# Patient Record
Sex: Female | Born: 1990 | Race: White | Hispanic: No | Marital: Single | Smoking: Never smoker
Health system: Southern US, Community
[De-identification: ages and names within clinical notes are randomized; demographics above are authoritative.]

## PROBLEM LIST (undated history)

## (undated) HISTORY — PX: NO PAST SURGERIES: SHX2092

---

## 2017-11-21 ENCOUNTER — Ambulatory Visit
Admission: EM | Admit: 2017-11-21 | Discharge: 2017-11-21 | Disposition: A | Discharge status: Home / Self Care | Payer: 59 | Attending: Family Medicine | Admitting: Family Medicine

## 2017-11-21 ENCOUNTER — Other Ambulatory Visit: Payer: Self-pay

## 2017-11-21 DIAGNOSIS — R69 Illness, unspecified: Secondary | ICD-10-CM

## 2017-11-21 DIAGNOSIS — R6883 Chills (without fever): Secondary | ICD-10-CM

## 2017-11-21 DIAGNOSIS — R05 Cough: Secondary | ICD-10-CM

## 2017-11-21 DIAGNOSIS — M791 Myalgia, unspecified site: Secondary | ICD-10-CM | POA: Diagnosis not present

## 2017-11-21 DIAGNOSIS — J111 Influenza due to unidentified influenza virus with other respiratory manifestations: Secondary | ICD-10-CM

## 2017-11-21 DIAGNOSIS — J029 Acute pharyngitis, unspecified: Secondary | ICD-10-CM | POA: Diagnosis not present

## 2017-11-21 LAB — RAPID STREP SCREEN (MED CTR MEBANE ONLY): STREPTOCOCCUS, GROUP A SCREEN (DIRECT): NEGATIVE

## 2017-11-21 MED ORDER — BENZONATATE 100 MG PO CAPS: 100.0000 mg | ORAL_CAPSULE | Freq: Three times a day (TID) | ORAL | 0 refills | Status: AC | PRN

## 2017-11-21 MED ORDER — HYDROCOD POLST-CPM POLST ER 10-8 MG/5ML PO SUER: 5.0000 mL | Freq: Every evening | ORAL | 0 refills | Status: AC | PRN

## 2017-11-21 MED ORDER — OSELTAMIVIR PHOSPHATE 75 MG PO CAPS: 75.0000 mg | ORAL_CAPSULE | Freq: Two times a day (BID) | ORAL | 0 refills | Status: AC

## 2017-11-21 NOTE — Discharge Instructions (Signed)
Take medication as prescribed. Rest. Drink plenty of fluids.  ° °Follow up with your primary care physician this week as needed. Return to Urgent care for new or worsening concerns.  ° °

## 2017-11-21 NOTE — ED Provider Notes (Signed)
[facility] ____________________________________________  Time seen: Approximately 5:54 PM  I have reviewed the triage vital signs and the nursing notes.   HISTORY  Chief Complaint Cough   HPI Kelly Cuevas is a 27 y.o. female presenting with significant other bedside for evaluation of runny nose, nasal congestion, cough, sore throat, chills, body aches present since Saturday night.  States unsure of what temperature was yesterday but definitely felt more so febrile, did not have a thermometer at home.  Has taken some over-the-counter ibuprofen yesterday as well as today with last dose being early this morning.  States sore throat is mild to moderate, but overall continues to drink fluids well, slight decrease in appetite.  The cough is primarily nonproductive hacking cough.  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  No other over-the-counter medications taken today for the same complaints.  Denies other aggravating factors.  Denies recent sickness. Denies chest pain, shortness of breath, abdominal pain, dysuria, or rash. Denies recent antibiotic use.   Patient's last menstrual period was 10/24/2017.Denies pregnancy.  History reviewed. No pertinent past medical history.  There are no active problems to display for this patient.   Past Surgical History:  Procedure Laterality Date  . NO PAST SURGERIES       No current facility-administered medications for this encounter.   Current Outpatient Medications:  .  benzonatate (TESSALON PERLES) 100 MG capsule, Take 1 capsule (100 mg total) by mouth 3 (three) times daily as needed for cough., Disp: 15 capsule, Rfl: 0 .  chlorpheniramine-HYDROcodone (TUSSIONEX PENNKINETIC ER) 10-8 MG/5ML SUER, Take 5 mLs by mouth at bedtime as needed for cough. do not drive or operate machinery while taking as can cause drowsiness., Disp: 75 mL, Rfl: 0 .  oseltamivir (TAMIFLU) 75 MG capsule, Take 1 capsule (75 mg  total) by mouth every 12 (twelve) hours., Disp: 10 capsule, Rfl: 0  Allergies Patient has no known allergies.  Family History  Problem Relation Age of Onset  . Cancer Mother   . Diabetes Father     Social History Social History   Tobacco Use  . Smoking status: Never Smoker  . Smokeless tobacco: Never Used  Substance Use Topics  . Alcohol use: Yes    Comment: rarely  . Drug use: No    Review of Systems Constitutional: As above. ENT: Positive sore throat. Cardiovascular: Denies chest pain. Respiratory: Denies shortness of breath. Gastrointestinal: No abdominal pain.   Skin: Negative for rash.  ____________________________________________   PHYSICAL EXAM:  VITAL SIGNS: ED Triage Vitals  Enc Vitals Group     BP 11/21/17 1642 140/75     Pulse Rate 11/21/17 1642 100     Resp 11/21/17 1642 18     Temp 11/21/17 1642 99.8 F (37.7 C)     Temp Source 11/21/17 1642 Oral     SpO2 11/21/17 1642 100 %     Weight 11/21/17 1640 280 lb (127 kg)     Height 11/21/17 1640 5\' 6"  (1.676 m)     Head Circumference --      Peak Flow --      Pain Score 11/21/17 1640 4     Pain Loc --      Pain Edu? --      Excl. in [place]? --    Constitutional: Alert and oriented. Well appearing and in no acute distress. Eyes: Conjunctivae are normal.  Head: Atraumatic. No sinus tenderness to palpation. No swelling. No erythema.  Ears: no  erythema, normal TMs bilaterally.   Nose:Nasal congestion with clear rhinorrhea  Mouth/Throat: Mucous membranes are moist. Mild pharyngeal erythema. No tonsillar swelling or exudate.  Neck: No stridor.  No cervical spine tenderness to palpation. Hematological/Lymphatic/Immunilogical: No cervical lymphadenopathy. Cardiovascular: Normal rate, regular rhythm. Grossly normal heart sounds.  Good peripheral circulation. Respiratory: Normal respiratory effort.  No retractions. No wheezes, rales or rhonchi. Good air movement.  Musculoskeletal: Ambulatory with steady gait.   Neurologic:  Normal speech and language. No gait instability. Skin:  Skin appears warm, dry and intact. No rash noted. Psychiatric: Mood and affect are normal. Speech and behavior are normal.  ___________________________________________   LABS (all labs ordered are listed, but only abnormal results are displayed)  Labs Reviewed  RAPID STREP SCREEN (NOT AT [facility])  CULTURE, GROUP A STREP [facility])     PROCEDURES Procedures    INITIAL IMPRESSION / ASSESSMENT AND PLAN / ED COURSE  Pertinent labs & imaging results that were available during my care of the patient were reviewed by me and considered in my medical decision making (see chart for details).  Overall well-appearing patient.  No acute distress.  Suspect influenza-like illness.  Quick strep negative, will culture.  Discussed treatment options with patient.  Will treat patient with oral Tamiflu, PRN Tessalon Perles, as needed Tussionex and over-the-counter decongestants as needed and Tylenol ibuprofen.  Encourage rest, fluids, supportive care.  Discussed strict follow-up and return parameters.  Work note given for today and tomorrow.Discussed indication, risks and benefits of medications with patient.  Discussed follow up with Primary care physician this week. Discussed follow up and return parameters including no resolution or any worsening concerns. Patient verbalized understanding and agreed to plan.   ____________________________________________   FINAL CLINICAL IMPRESSION(S) / ED DIAGNOSES  Final diagnoses:  Influenza-like illness     ED Discharge Orders        Ordered    oseltamivir (TAMIFLU) 75 MG capsule  Every 12 hours     11/21/17 1840    chlorpheniramine-HYDROcodone (TUSSIONEX PENNKINETIC ER) 10-8 MG/5ML SUER  At bedtime PRN     11/21/17 1840    benzonatate (TESSALON PERLES) 100 MG capsule  3 times daily PRN     11/21/17 1840       Note: This dictation was prepared with Dragon dictation along with smaller  phrase technology. Any transcriptional errors that result from this process are unintentional.         Renford DillsMiller, Rielle Schlauch, NP 11/21/17 1900

## 2017-11-21 NOTE — ED Triage Notes (Signed)
Patient complains of cough and congestion. Patient states that she has been having symptoms for 2-3 days. Patient states that she has recently been helping a friend move from a house that had a great deal of mold. Patient is thinking her symptoms may have been brought on from that.

## 2017-11-24 ENCOUNTER — Telehealth: Payer: Self-pay | Admitting: Emergency Medicine

## 2017-11-24 LAB — CULTURE, GROUP A STREP (THRC)

## 2017-11-24 NOTE — Telephone Encounter (Signed)
Called to follow up after patient's recent visit. Patient states she is starting to feel better. 

## 2019-06-12 ENCOUNTER — Emergency Department
Admission: EM | Admit: 2019-06-12 | Discharge: 2019-06-12 | Disposition: A | Discharge status: Home / Self Care | Payer: 59 | Attending: Emergency Medicine | Admitting: Emergency Medicine

## 2019-06-12 ENCOUNTER — Encounter: Payer: Self-pay | Admitting: Emergency Medicine

## 2019-06-12 ENCOUNTER — Emergency Department: Payer: 59

## 2019-06-12 ENCOUNTER — Other Ambulatory Visit: Payer: Self-pay

## 2019-06-12 DIAGNOSIS — Z79899 Other long term (current) drug therapy: Secondary | ICD-10-CM | POA: Insufficient documentation

## 2019-06-12 DIAGNOSIS — R0602 Shortness of breath: Secondary | ICD-10-CM | POA: Diagnosis not present

## 2019-06-12 DIAGNOSIS — R0789 Other chest pain: Secondary | ICD-10-CM | POA: Diagnosis present

## 2019-06-12 DIAGNOSIS — K219 Gastro-esophageal reflux disease without esophagitis: Secondary | ICD-10-CM | POA: Insufficient documentation

## 2019-06-12 DIAGNOSIS — R079 Chest pain, unspecified: Secondary | ICD-10-CM

## 2019-06-12 LAB — CBC
HCT: 37.4 % (ref 36.0–46.0)
Hemoglobin: 12.4 g/dL (ref 12.0–15.0)
MCH: 30 pg (ref 26.0–34.0)
MCHC: 33.2 g/dL (ref 30.0–36.0)
MCV: 90.6 fL (ref 80.0–100.0)
Platelets: 253 10*3/uL (ref 150–400)
RBC: 4.13 MIL/uL (ref 3.87–5.11)
RDW: 11.9 % (ref 11.5–15.5)
WBC: 6.5 10*3/uL (ref 4.0–10.5)
nRBC: 0 % (ref 0.0–0.2)

## 2019-06-12 LAB — BASIC METABOLIC PANEL
Anion gap: 6 (ref 5–15)
BUN: 14 mg/dL (ref 6–20)
CO2: 25 mmol/L (ref 22–32)
Calcium: 9.1 mg/dL (ref 8.9–10.3)
Chloride: 106 mmol/L (ref 98–111)
Creatinine, Ser: 0.61 mg/dL (ref 0.44–1.00)
GFR calc Af Amer: >60 mL/min (ref >60)
GFR calc non Af Amer: >60 mL/min (ref >60)
Glucose, Bld: 99 mg/dL (ref 70–99)
Potassium: 3.7 mmol/L (ref 3.5–5.1)
Sodium: 137 mmol/L (ref 135–145)

## 2019-06-12 LAB — TROPONIN I (HIGH SENSITIVITY)
Troponin I (High Sensitivity): <2 ng/L (ref <18)
Troponin I (High Sensitivity): <2 ng/L (ref <18)

## 2019-06-12 MED ORDER — LIDOCAINE VISCOUS HCL 2 % MT SOLN
15.0000 mL | Freq: Once | OROMUCOSAL | Status: AC
  Administered 2019-06-12: 15 mL via ORAL
  Filled 2019-06-12: qty 15

## 2019-06-12 MED ORDER — ALUM & MAG HYDROXIDE-SIMETH 200-200-20 MG/5ML PO SUSP
30.0000 mL | Freq: Once | ORAL | Status: AC
  Administered 2019-06-12: 30 mL via ORAL
  Filled 2019-06-12: qty 30

## 2019-06-12 MED ORDER — FAMOTIDINE 40 MG PO TABS: 40.0000 mg | ORAL_TABLET | Freq: Every evening | ORAL | 1 refills | Status: AC

## 2019-06-12 NOTE — ED Notes (Signed)
Patient states she was sleeping and was awaken by the feeling of not being able to breath and was having some chest pain in middle of chest. Earlier in week was having back pain and pain shooting into left shoulder.

## 2019-06-12 NOTE — ED Triage Notes (Signed)
Pt reports she woke this morning with sudden onset of SOB and central chest pain. Pt has hx/o anxiety. Pt sts, "I don't know if its just anxiety because im kinda going through a lot. I am going through a separation and just came out to my family." Pt is tearful in triage.

## 2019-06-12 NOTE — Discharge Instructions (Addendum)

## 2019-06-12 NOTE — ED Provider Notes (Signed)
7:00 AM Assumed care for off going team.   Blood pressure (!) 147/128, pulse 85, temperature 98.5 F (36.9 C), temperature source Oral, resp. rate 20, height 5\' 6"  (1.676 m), weight 128.4 kg, SpO2 99 %.  See their HPI for full report but in brief   Repeat trop-woke up with SOB, chest pain. Does have some anxiety as well.  GI cocktail. Pain resolved. If second trop negative can d/c.   7:45 AM reevaluated patient.  Second cardiac marker was negative.  Reevaluated patient and updated on results.  Patient feels comfortable with discharge home.  Patient will monitor symptoms and follow-up with her primary care doctor or return to the ER if her symptoms are worsening.  I discussed the provisional nature of ED diagnosis, the treatment so far, the ongoing plan of care, follow up appointments and return precautions with the patient and any family or support people present. They expressed understanding and agreed with the plan, discharged home.         Vanessa Griggs, MD 06/12/19 [phone]

## 2019-06-12 NOTE — ED Provider Notes (Signed)
[facility] Emergency Department Provider Note  ____________________________________________  Time seen: Approximately 4:36 AM  I have reviewed the triage vital signs and the nursing notes.   HISTORY  Chief Complaint Chest Pain and Shortness of Breath   HPI Kelly Cuevas is a 28 y.o. female with a history of anxiety who presents for evaluation of chest pain and shortness of breath.  Patient reports that over the last several months she has been suffering from a lot of anxiety and stress.  She has been having anxiety attacks.  She went to bed last night and she was in her usual state of health.  She reports waking up briefly in the middle of the night to notice that she was having trouble breathing.  She reports that she can sometimes wake up like that when she has a bad dream but her symptoms usually resolve within a few minutes.  When the symptoms did not get better patient started to get very anxious and started having chest pain that she describes as pressure in the center of her chest.  She sat up next to the bed, waiting for a friend to pick her up to bring her to the emergency room, she started feeling lightheaded.  At this time she feels improved but is still complaining of mild central chest pressure.  Shortness of breath has resolved.  No SI or HI.  She is not a smoker.  She smokes marijuana.  Denies any other drug use.  No personal or family history of heart attacks, PE or DVT, recent travel immobilization, leg pain or swelling, hemoptysis, or exogenous hormones.   PMH Anxiety Obesity  Past Surgical History:  Procedure Laterality Date  . NO PAST SURGERIES      Prior to Admission medications   Medication Sig Start Date End Date Taking? Authorizing Provider  benzonatate (TESSALON PERLES) 100 MG capsule Take 1 capsule (100 mg total) by mouth 3 (three) times daily as needed for cough. 11/21/17   Renford DillsMiller, [place], NP  chlorpheniramine-HYDROcodone  [facility](TUSSIONEX PENNKINETIC ER) 10-8 MG/5ML SUER Take 5 mLs by mouth at bedtime as needed for cough. do not drive or operate machinery while taking as can cause drowsiness. 11/21/17   Renford DillsMiller, [place], NP  famotidine (PEPCID) 40 MG tablet Take 1 tablet (40 mg total) by mouth every evening. 06/12/19 06/11/20  Nita SickleVeronese, [place], MD  oseltamivir (TAMIFLU) 75 MG capsule Take 1 capsule (75 mg total) by mouth every 12 (twelve) hours. 11/21/17   Renford DillsMiller, [place], NP    Allergies Patient has no known allergies.  Family History  Problem Relation Age of Onset  . Cancer Mother   . Diabetes Father     Social History Social History   Tobacco Use  . Smoking status: Never Smoker  . Smokeless tobacco: Never Used  Substance Use Topics  . Alcohol use: Yes    Comment: rarely  . Drug use: No    Review of Systems  Constitutional: Negative for fever. Eyes: Negative for visual changes. ENT: Negative for sore throat. Neck: No neck pain  Cardiovascular: + chest pain. Respiratory: + shortness of breath. Gastrointestinal: Negative for abdominal pain, vomiting or diarrhea. Genitourinary: Negative for dysuria. Musculoskeletal: Negative for back pain. Skin: Negative for rash. Neurological: Negative for headaches, weakness or numbness. Psych: No SI or HI. + anxiety  ____________________________________________   PHYSICAL EXAM:  VITAL SIGNS: ED Triage Vitals  Enc Vitals Group     BP 06/12/19 0415 (!) 147/128     Pulse  Rate 06/12/19 0415 85     Resp 06/12/19 0415 20     Temp 06/12/19 0415 98.5 F (36.9 C)     Temp Source 06/12/19 0415 Oral     SpO2 06/12/19 0415 99 %     Weight 06/12/19 0412 283 lb (128.4 kg)     Height 06/12/19 0412 5\' 6"  (1.676 m)     Head Circumference --      Peak Flow --      Pain Score 06/12/19 0411 4     Pain Loc --      Pain Edu? --      Excl. in [place]? --     Constitutional: Alert and oriented, anxious but in no distress. HEENT:      Head: Normocephalic and atraumatic.          Eyes: Conjunctivae are normal. Sclera is non-icteric.       Mouth/Throat: Mucous membranes are moist.       Neck: Supple with no signs of meningismus. Cardiovascular: Regular rate and rhythm. No murmurs, gallops, or rubs. 2+ symmetrical distal pulses are present in all extremities. No JVD. Respiratory: Normal respiratory effort. Lungs are clear to auscultation bilaterally. No wheezes, crackles, or rhonchi.  Gastrointestinal: Soft, non tender, and non distended with positive bowel sounds. No rebound or guarding. Musculoskeletal: Nontender with normal range of motion in all extremities. No edema, cyanosis, or erythema of extremities. Neurologic: Normal speech and language. Face is symmetric. Moving all extremities. No gross focal neurologic deficits are appreciated. Skin: Skin is warm, dry and intact. No rash noted. Psychiatric: Mood and affect are normal. Speech and behavior are normal.  ____________________________________________   LABS (all labs ordered are listed, but only abnormal results are displayed)  Labs Reviewed  BASIC METABOLIC PANEL  CBC  POC URINE PREG, ED  TROPONIN I (HIGH SENSITIVITY)  TROPONIN I (HIGH SENSITIVITY)   ____________________________________________  EKG  ED ECG REPORT I, Rudene Re, the attending physician, personally viewed and interpreted this ECG.  Normal sinus rhythm, rate of 81, normal intervals, normal axis, no ST elevations or depressions. ____________________________________________  RADIOLOGY  I have personally reviewed the images performed during this visit and I agree with the Radiologist's read.   Interpretation by Radiologist:  Dg Chest Portable 1 View  Result Date: 06/12/2019 CLINICAL DATA:  Shortness of breath EXAM: PORTABLE CHEST 1 VIEW COMPARISON:  None. FINDINGS: Normal heart size and mediastinal contours. No acute infiltrate or edema. No effusion or pneumothorax. No acute osseous findings. IMPRESSION: Negative  chest. Electronically Signed   By: Monte Fantasia M.D.   On: 06/12/2019 05:01      ____________________________________________   PROCEDURES  Procedure(s) performed: None Procedures Critical Care performed:  None ____________________________________________   INITIAL IMPRESSION / ASSESSMENT AND PLAN / ED COURSE   28 y.o. female with a history of anxiety who presents for evaluation of chest pain and shortness of breath.  Patient is extremely anxious but in no other obvious distress.  EKG is nonischemic.  Will further stratified with troponin x2.  Low suspicion for ACS.  Will get a chest x-ray to rule out pneumothorax, pulmonary edema, pneumonia.  No signs or symptoms of COVID.  Will check labs to rule out anemia, dehydration, electrolyte abnormalities, AKI.  Will monitor on telemetry for any signs of dysrhythmias.  Will give a GI cocktail for possible reflux.  Patient denies SI or HI, does not meet IVC criteria.  PERC negative.  Clinical Course as of Jun 11 637  Tue Jun 12, 2019  [id number] Pain resolved with a GI cocktail.  Possibly reflux as patient seems to be having these episodes while laying flat. First troponin is negative.  Second troponin is pending.  Care transferred to incoming physician at 7 AM with plan to follow-up results of second troponin and reassess patient for disposition.   [CV]    Clinical Course User Index [CV] Don PerkingVeronese, [place], MD      As part of my medical decision making, I reviewed the following data within the electronic MEDICAL RECORD NUMBER Nursing notes reviewed and incorporated, Labs reviewed , EKG interpreted , Old EKG reviewed, Old chart reviewed, Radiograph reviewed , Notes from prior ED visits and [place] Controlled Substance Database   Patient was evaluated in Emergency Department today for the symptoms described in the history of present illness. Patient was evaluated in the context of the global COVID-19 pandemic, which necessitated consideration that the  patient might be at risk for infection with the SARS-CoV-2 virus that causes COVID-19. Institutional protocols and algorithms that pertain to the evaluation of patients at risk for COVID-19 are in a state of rapid change based on information released by regulatory bodies including the CDC and federal and state organizations. These policies and algorithms were followed during the patient's care in the ED.   ____________________________________________   FINAL CLINICAL IMPRESSION(S) / ED DIAGNOSES   Final diagnoses:  Chest pain, unspecified type  Shortness of breath  Gastroesophageal reflux disease, esophagitis presence not specified      NEW MEDICATIONS STARTED DURING THIS VISIT:  ED Discharge Orders         Ordered    famotidine (PEPCID) 40 MG tablet  Every evening     06/12/19 [id number]           Note:  This document was prepared using Dragon voice recognition software and may include unintentional dictation errors.    Don PerkingVeronese, [place], MD 06/12/19 [phone]

## 2019-06-12 NOTE — ED Notes (Signed)
Pt discharge instructions reviewed. Unable to electronically sign. Paper copy was printed, signed and placed in medical records pick-up bin.

## 2020-12-17 ENCOUNTER — Other Ambulatory Visit: Payer: Self-pay

## 2020-12-17 ENCOUNTER — Emergency Department
Admission: EM | Admit: 2020-12-17 | Discharge: 2020-12-17 | Disposition: A | Discharge status: Home / Self Care | Payer: Self-pay | Attending: Emergency Medicine | Admitting: Emergency Medicine

## 2020-12-17 ENCOUNTER — Encounter: Payer: Self-pay | Admitting: Emergency Medicine

## 2020-12-17 ENCOUNTER — Emergency Department: Payer: Self-pay

## 2020-12-17 DIAGNOSIS — R0602 Shortness of breath: Secondary | ICD-10-CM | POA: Insufficient documentation

## 2020-12-17 DIAGNOSIS — R072 Precordial pain: Secondary | ICD-10-CM | POA: Insufficient documentation

## 2020-12-17 DIAGNOSIS — R002 Palpitations: Secondary | ICD-10-CM | POA: Insufficient documentation

## 2020-12-17 DIAGNOSIS — Z8616 Personal history of COVID-19: Secondary | ICD-10-CM | POA: Insufficient documentation

## 2020-12-17 LAB — CBC
HCT: 37.7 % (ref 36.0–46.0)
Hemoglobin: 12.9 g/dL (ref 12.0–15.0)
MCH: 30.5 pg (ref 26.0–34.0)
MCHC: 34.2 g/dL (ref 30.0–36.0)
MCV: 89.1 fL (ref 80.0–100.0)
Platelets: 261 10*3/uL (ref 150–400)
RBC: 4.23 MIL/uL (ref 3.87–5.11)
RDW: 11.9 % (ref 11.5–15.5)
WBC: 7.9 10*3/uL (ref 4.0–10.5)
nRBC: 0 % (ref 0.0–0.2)

## 2020-12-17 LAB — TROPONIN I (HIGH SENSITIVITY)
Troponin I (High Sensitivity): <2 ng/L (ref <18)
Troponin I (High Sensitivity): <2 ng/L (ref <18)

## 2020-12-17 LAB — BASIC METABOLIC PANEL
Anion gap: 7 (ref 5–15)
BUN: 11 mg/dL (ref 6–20)
CO2: 26 mmol/L (ref 22–32)
Calcium: 9.6 mg/dL (ref 8.9–10.3)
Chloride: 103 mmol/L (ref 98–111)
Creatinine, Ser: 0.67 mg/dL (ref 0.44–1.00)
GFR, Estimated: >60 mL/min (ref >60)
Glucose, Bld: 93 mg/dL (ref 70–99)
Potassium: 4 mmol/L (ref 3.5–5.1)
Sodium: 136 mmol/L (ref 135–145)

## 2020-12-17 LAB — POC URINE PREG, ED: Preg Test, Ur: NEGATIVE

## 2020-12-17 NOTE — ED Provider Notes (Signed)
[facility] Emergency Department Provider Note ____________________________________________   Event Date/Time   First MD Initiated Contact with Patient 12/17/20 2258     (approximate)  I have reviewed the triage vital signs and the nursing notes.  HISTORY  Chief Complaint Chest Pain and Shortness of Breath   HPI Kelly Cuevas is a 30 y.o. femalewho presents to the ED for evaluation of chest pain and SOB.   Chart review indicates hx obesity.  Patient self-reports a diagnosis of COVID-19 about 6 weeks ago and she was managed as an outpatient at home. Takes no prescription medications and not on OCPs.  Patient presents with her fianc, who provides additional history, for evaluation of palpitations and shortness of breath for the past "few weeks."  She reports that since being diagnosed with COVID-19 she has had dyspnea on exertion, more short of breath and with intermittent pain/palpitations to her substernal chest.  She intermittently describes palpitations while otherwise describing pain to her chest and is inconsistent.  She reports pain lasting a matter of seconds-minutes before self resolving, 2-5 times per day the past few weeks.  She reports telling her fianc about these symptoms earlier today, so he urged her to come to the ED for evaluation.  She reports no chest pain here.   History reviewed. No pertinent past medical history.  There are no problems to display for this patient.   Past Surgical History:  Procedure Laterality Date  . NO PAST SURGERIES      Prior to Admission medications   Medication Sig Start Date End Date Taking? Authorizing Provider  benzonatate (TESSALON PERLES) 100 MG capsule Take 1 capsule (100 mg total) by mouth 3 (three) times daily as needed for cough. 11/21/17   Renford Dills, NP  chlorpheniramine-HYDROcodone [facility] PENNKINETIC ER) 10-8 MG/5ML SUER Take 5 mLs by mouth at bedtime as needed for cough. do not  drive or operate machinery while taking as can cause drowsiness. 11/21/17   Renford Dills, NP  famotidine (PEPCID) 40 MG tablet Take 1 tablet (40 mg total) by mouth every evening. 06/12/19 06/11/20  Nita Sickle, MD  oseltamivir (TAMIFLU) 75 MG capsule Take 1 capsule (75 mg total) by mouth every 12 (twelve) hours. 11/21/17   Renford Dills, NP    Allergies Patient has no known allergies.  Family History  Problem Relation Age of Onset  . Cancer Mother   . Diabetes Father     Social History Social History   Tobacco Use  . Smoking status: Never Smoker  . Smokeless tobacco: Never Used  Vaping Use  . Vaping Use: Every day  Substance Use Topics  . Alcohol use: Yes    Comment: rarely  . Drug use: No    Review of Systems  Constitutional: No fever/chills Eyes: No visual changes. ENT: No sore throat. Cardiovascular: Positive for chest pain or palpitations. Respiratory: Positive for shortness of breath. Gastrointestinal: No abdominal pain.  No nausea, no vomiting.  No diarrhea.  No constipation. Genitourinary: Negative for dysuria. Musculoskeletal: Negative for back pain. Skin: Negative for rash. Neurological: Negative for headaches, focal weakness or numbness.  ____________________________________________   PHYSICAL EXAM:  VITAL SIGNS: Vitals:   12/17/20 2234 12/17/20 2330  BP:  104/73  Pulse:  (!) 59  Resp:  16  Temp:    SpO2: 100% 100%     Constitutional: Alert and oriented. Well appearing and in no acute distress.  Obese.  Pleasant and conversational in full sentences. Eyes: Conjunctivae are normal. PERRL.  EOMI. Head: Atraumatic. Nose: No congestion/rhinnorhea. Mouth/Throat: Mucous membranes are moist.  Oropharynx non-erythematous. Neck: No stridor. No cervical spine tenderness to palpation. Cardiovascular: Normal rate, regular rhythm. Grossly normal heart sounds.  Good peripheral circulation. Respiratory: Normal respiratory effort.  No retractions. Lungs  CTAB. Gastrointestinal: Soft , nondistended, nontender to palpation. No CVA tenderness. Musculoskeletal: No lower extremity tenderness nor edema.  No joint effusions. No signs of acute trauma. Neurologic:  Normal speech and language. No gross focal neurologic deficits are appreciated. No gait instability noted. Skin:  Skin is warm, dry and intact. No rash noted. Psychiatric: Mood and affect are normal. Speech and behavior are normal.  ____________________________________________   LABS (all labs ordered are listed, but only abnormal results are displayed)  Labs Reviewed  BASIC METABOLIC PANEL  CBC  POC URINE PREG, ED  TROPONIN I (HIGH SENSITIVITY)  TROPONIN I (HIGH SENSITIVITY)   ____________________________________________  12 Lead EKG  Rhythm, rate of 61 bpm.  Normal axis and intervals.  No evidence of acute ischemia. ____________________________________________  RADIOLOGY  ED MD interpretation: 2 view CXR reviewed by me without evidence of acute cardiopulmonary pathology.  Official radiology report(s): DG Chest 2 View  Result Date: 12/17/2020 CLINICAL DATA:  Chest pain, shortness of breath EXAM: CHEST - 2 VIEW COMPARISON:  06/12/2019 FINDINGS: The heart size and mediastinal contours are within normal limits. Both lungs are clear. The visualized skeletal structures are unremarkable. IMPRESSION: No active cardiopulmonary disease. Electronically Signed   By: Sharlet Salina M.D.   On: 12/17/2020 18:03    ____________________________________________   PROCEDURES and INTERVENTIONS  Procedure(s) performed (including Critical Care):  .1-3 Lead EKG Interpretation Performed by: Delton Prairie, MD Authorized by: Delton Prairie, MD     Interpretation: normal     ECG rate:  62   ECG rate assessment: normal     Rhythm: sinus rhythm     Ectopy: none     Conduction: normal      Medications - No data to display  ____________________________________________   MDM / ED  COURSE   Obese 30 year old woman presents to the ED with subacute palpitations, dyspnea on exertion and chest discomfort, without evidence of acute pathology, and amenable to outpatient management.  Normal vitals on room air.  Exam is generally reassuring, demonstrating an obese patient who has no evidence of acute derangements.  No tachypnea, dyspnea, neurovascular deficits, distress or signs of trauma.  Blood work is benign without acute pathology.  EKG is nonischemic and high-sensitivity troponin is negative.  Patient is PERC negative.  I discussed with her the possibility of acute PE despite being PERC negative in the setting of recent COVID-19, but she reports reassurance with her work-up so far and refuses D-dimer or CTA chest.  We discussed outpatient management of her symptoms and return precautions for the ED.  Patient medically stable for outpatient management.  Clinical Course as of 12/17/20 2347  Wed Dec 17, 2020  2333 Extensive discussion at bedside with patient and her fianc regarding the possibility of acute PE.  We discussed that she is PERC negative, but this was performed prior to COVID-19.  We discussed overall reassuring work-up without signs of PE.  She reports that she is reassured and has no desire to stay for D-dimer or CTA chest.  She is requesting discharge.  We discussed outpatient management of her likely Covid-related symptoms.  We discussed return precautions for the ED.  Answered questions. [DS]    Clinical Course User Index [DS] Delton Prairie, MD  ____________________________________________   FINAL CLINICAL IMPRESSION(S) / ED DIAGNOSES  Final diagnoses:  Shortness of breath     ED Discharge Orders    None       Crystal Scarberry Katrinka Blazing   Note:  This document was prepared using Dragon voice recognition software and may include unintentional dictation errors.   Delton Prairie, MD 12/17/20 [phone]

## 2020-12-17 NOTE — ED Notes (Signed)
Pt given urine cup.

## 2020-12-17 NOTE — ED Triage Notes (Signed)
Pt comes into the ED via POV c/o chest pain and SHOB.  Pt states she has also had a "jabbing pain" in the shoulder.  Pt was dx with COVID last month.  Pt states her heart is palpating and the discomfort is centrally located.  Pt states the [facility] is increased significantly with exertion. Pt denies any sharp discomfort when deep breath is taken.  PT in NAD at this time and is ambulatory to triage.

## 2020-12-17 NOTE — Discharge Instructions (Signed)
Please take Tylenol and ibuprofen/Advil for your pain.  It is safe to take them together, or to alternate them every few hours.  Take up to 1000mg  of Tylenol at a time, up to 4 times per day.  Do not take more than 4000 mg of Tylenol in 24 hours.  For ibuprofen, take 400-600 mg, 4-5 times per day.  Return to the ED with any worsening chest pain with shortness of breath, passing out or severely worsening symptoms.

## 2020-12-17 NOTE — ED Notes (Signed)
This RN walked pt. O2 sat was 100% at the beginning, during, and at the end of the walk. Pt also states that chest has more pressure than pain.

## 2021-05-04 IMAGING — DX PORTABLE CHEST - 1 VIEW
1 series · 1 of 1 positions shown · non-contrast
Comparison: None.

CLINICAL DATA: Shortness of breath

EXAM:
PORTABLE CHEST 1 VIEW

[chest ap]
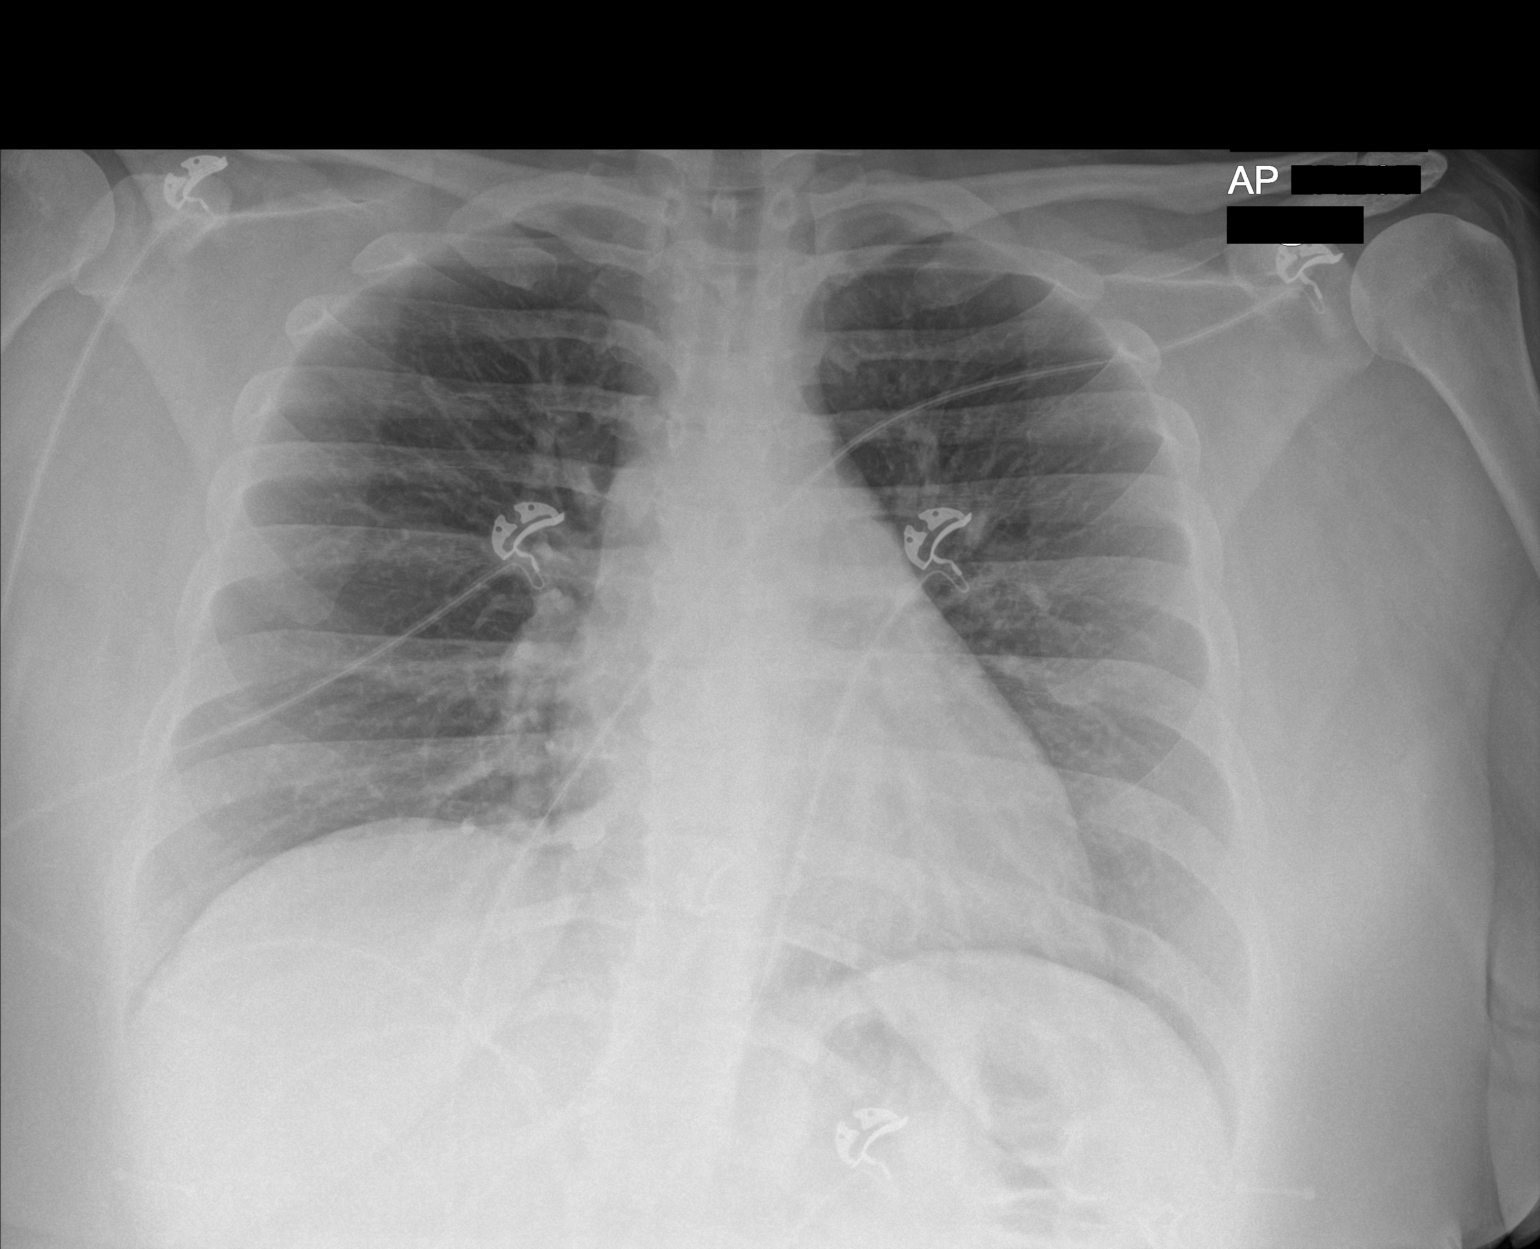

[1 of 1 positions shown; findings below may reference images not displayed]

FINDINGS: Normal heart size and mediastinal contours. No acute infiltrate or
edema. No effusion or pneumothorax. No acute osseous findings.
IMPRESSION: Negative chest.

## 2022-05-20 ENCOUNTER — Other Ambulatory Visit: Payer: Self-pay | Admitting: Family Medicine

## 2022-05-20 DIAGNOSIS — N926 Irregular menstruation, unspecified: Secondary | ICD-10-CM

## 2022-06-02 ENCOUNTER — Ambulatory Visit: Payer: Self-pay

## 2022-11-09 IMAGING — CR DG CHEST 2V
1 series · 2 of 2 positions shown · non-contrast
Comparison: 06/12/2019

CLINICAL DATA: Chest pain, shortness of breath

EXAM:
CHEST - 2 VIEW

[Series 1: dg chest 2 view · 0.14mm/px · 2 of 2 slices shown]
[im 1/2]
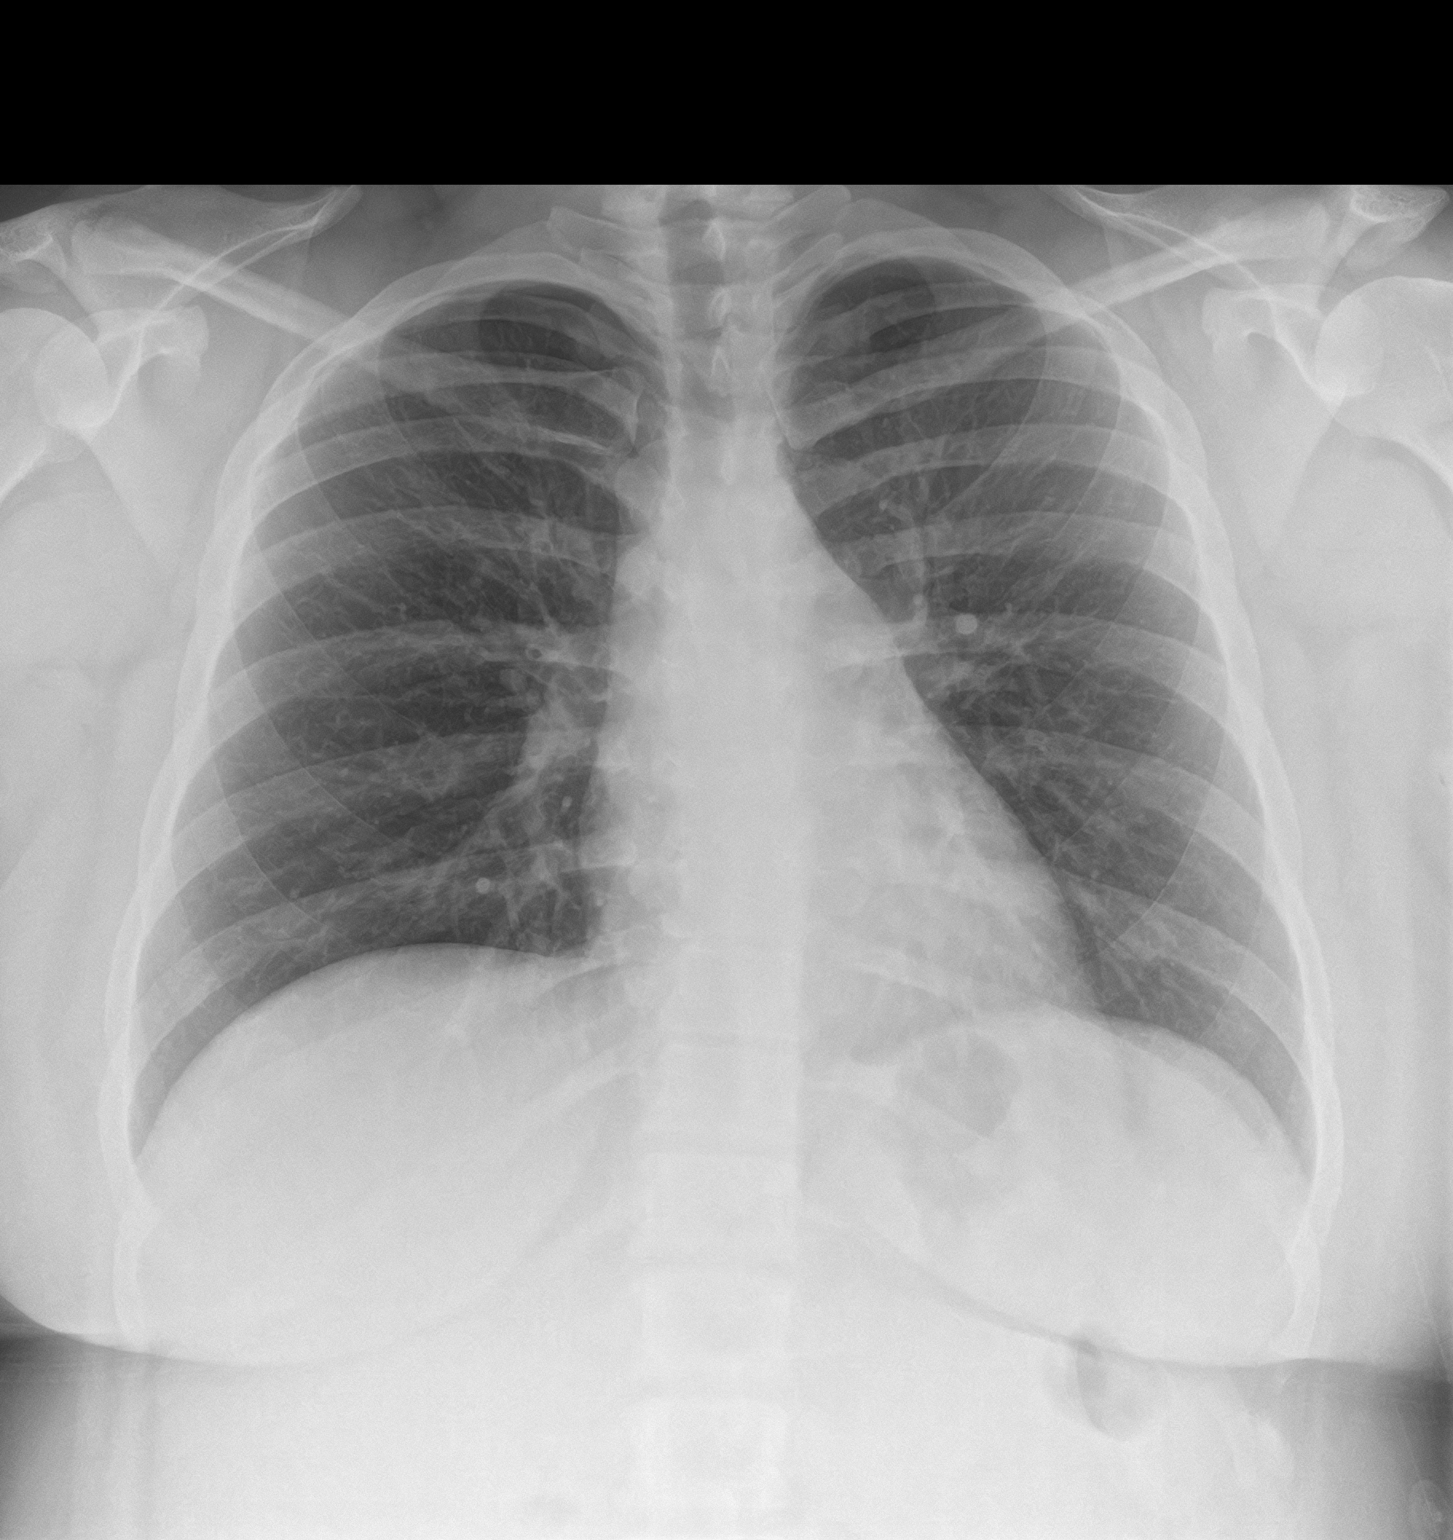
[im 2/2]
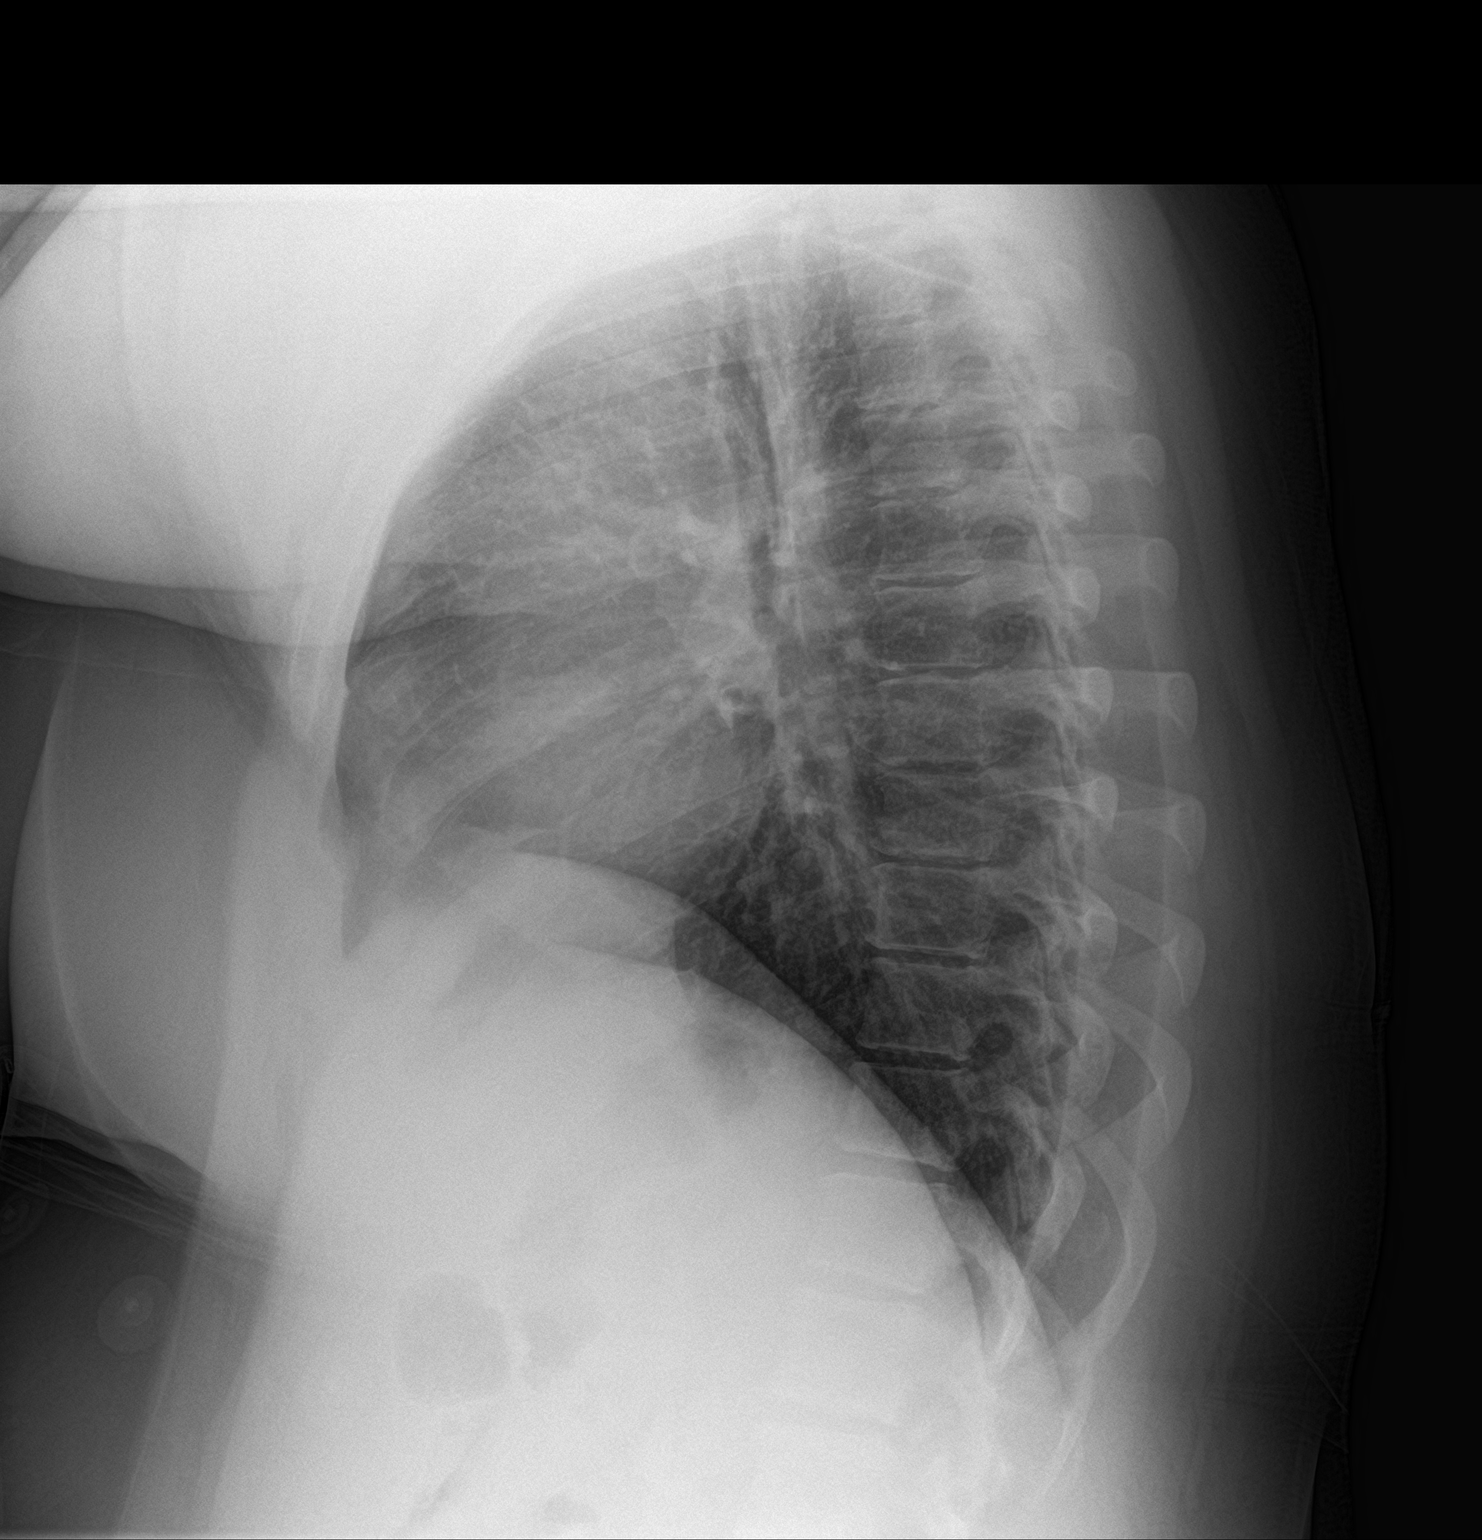

[2 of 2 positions shown; findings below may reference images not displayed]

FINDINGS: The heart size and mediastinal contours are within normal limits.
Both lungs are clear. The visualized skeletal structures are
unremarkable.
IMPRESSION: No active cardiopulmonary disease.
